# Patient Record
Sex: Female | Born: 1990 | Race: White | Hispanic: No | Marital: Single | Smoking: Current every day smoker
Health system: Southern US, Community
[De-identification: ages and names within clinical notes are randomized; demographics above are authoritative.]

---

## 2018-01-04 ENCOUNTER — Emergency Department (HOSPITAL_COMMUNITY): Payer: Medicaid Other

## 2018-01-04 ENCOUNTER — Emergency Department (HOSPITAL_COMMUNITY)
Admission: EM | Admit: 2018-01-04 | Discharge: 2018-01-04 | Disposition: A | Discharge status: Home / Self Care | Payer: Medicaid Other | Attending: Emergency Medicine | Admitting: Emergency Medicine

## 2018-01-04 ENCOUNTER — Encounter (HOSPITAL_COMMUNITY): Payer: Self-pay

## 2018-01-04 DIAGNOSIS — Y939 Activity, unspecified: Secondary | ICD-10-CM | POA: Insufficient documentation

## 2018-01-04 DIAGNOSIS — Y999 Unspecified external cause status: Secondary | ICD-10-CM | POA: Insufficient documentation

## 2018-01-04 DIAGNOSIS — F17210 Nicotine dependence, cigarettes, uncomplicated: Secondary | ICD-10-CM | POA: Insufficient documentation

## 2018-01-04 DIAGNOSIS — S0990XA Unspecified injury of head, initial encounter: Secondary | ICD-10-CM | POA: Insufficient documentation

## 2018-01-04 DIAGNOSIS — N3001 Acute cystitis with hematuria: Secondary | ICD-10-CM | POA: Insufficient documentation

## 2018-01-04 DIAGNOSIS — Y929 Unspecified place or not applicable: Secondary | ICD-10-CM | POA: Diagnosis not present

## 2018-01-04 LAB — BASIC METABOLIC PANEL
Anion gap: 12 (ref 5–15)
BUN: 8 mg/dL (ref 6–20)
CALCIUM: 9 mg/dL (ref 8.9–10.3)
CHLORIDE: 107 mmol/L (ref 98–111)
CO2: 20 mmol/L — ABNORMAL LOW (ref 22–32)
CREATININE: 0.89 mg/dL (ref 0.44–1.00)
GFR calc non Af Amer: >60 mL/min (ref >60)
Glucose, Bld: 96 mg/dL (ref 70–99)
Potassium: 4.6 mmol/L (ref 3.5–5.1)
SODIUM: 139 mmol/L (ref 135–145)

## 2018-01-04 LAB — URINALYSIS, ROUTINE W REFLEX MICROSCOPIC
BILIRUBIN URINE: NEGATIVE
GLUCOSE, UA: NEGATIVE mg/dL
KETONES UR: 5 mg/dL — AB
Nitrite: POSITIVE — AB
PH: 7 (ref 5.0–8.0)
PROTEIN: NEGATIVE mg/dL
Specific Gravity, Urine: 1.036 — ABNORMAL HIGH (ref 1.005–1.030)

## 2018-01-04 LAB — CBC WITH DIFFERENTIAL/PLATELET
Abs Immature Granulocytes: 0 10*3/uL (ref 0.0–0.1)
BASOS PCT: 1 %
Basophils Absolute: 0.1 10*3/uL (ref 0.0–0.1)
EOS ABS: 0.1 10*3/uL (ref 0.0–0.7)
Eosinophils Relative: 1 %
HCT: 42.2 % (ref 36.0–46.0)
Hemoglobin: 13.8 g/dL (ref 12.0–15.0)
IMMATURE GRANULOCYTES: 0 %
Lymphocytes Relative: 16 %
Lymphs Abs: 2.2 10*3/uL (ref 0.7–4.0)
MCH: 30 pg (ref 26.0–34.0)
MCHC: 32.7 g/dL (ref 30.0–36.0)
MCV: 91.7 fL (ref 78.0–100.0)
Monocytes Absolute: 0.9 10*3/uL (ref 0.1–1.0)
Monocytes Relative: 6 %
NEUTROS PCT: 76 %
Neutro Abs: 10.5 10*3/uL — ABNORMAL HIGH (ref 1.7–7.7)
PLATELETS: 224 10*3/uL (ref 150–400)
RBC: 4.6 MIL/uL (ref 3.87–5.11)
RDW: 15.6 % — AB (ref 11.5–15.5)
WBC: 13.8 10*3/uL — AB (ref 4.0–10.5)

## 2018-01-04 LAB — I-STAT BETA HCG BLOOD, ED (MC, WL, AP ONLY)

## 2018-01-04 MED ORDER — TETANUS-DIPHTH-ACELL PERTUSSIS 5-2.5-18.5 LF-MCG/0.5 IM SUSP
0.5000 mL | Freq: Once | INTRAMUSCULAR | Status: AC
  Administered 2018-01-04: 0.5 mL via INTRAMUSCULAR
  Filled 2018-01-04: qty 0.5

## 2018-01-04 MED ORDER — CEPHALEXIN 500 MG PO CAPS: 500.0000 mg | ORAL_CAPSULE | Freq: Two times a day (BID) | ORAL | 0 refills | Status: DC

## 2018-01-04 MED ORDER — METHOCARBAMOL 500 MG PO TABS: 500.0000 mg | ORAL_TABLET | Freq: Two times a day (BID) | ORAL | 0 refills | Status: DC

## 2018-01-04 MED ORDER — SODIUM CHLORIDE 0.9 % IV BOLUS
1000.0000 mL | Freq: Once | INTRAVENOUS | Status: AC
  Administered 2018-01-04: 1000 mL via INTRAVENOUS

## 2018-01-04 MED ORDER — IOPAMIDOL (ISOVUE-300) INJECTION 61%
INTRAVENOUS | Status: AC
  Administered 2018-01-04: 100 mL
  Filled 2018-01-04: qty 100

## 2018-01-04 MED ORDER — MORPHINE SULFATE (PF) 4 MG/ML IV SOLN
4.0000 mg | Freq: Once | INTRAVENOUS | Status: AC
  Administered 2018-01-04: 4 mg via INTRAVENOUS
  Filled 2018-01-04: qty 1

## 2018-01-04 MED ORDER — NAPROXEN 375 MG PO TABS: 375.0000 mg | ORAL_TABLET | Freq: Two times a day (BID) | ORAL | 0 refills | Status: DC
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
## 2018-01-04 NOTE — ED Notes (Signed)
Patient verbalizes understanding of medications and discharge instructions. No further questions at this time. VSS and patient ambulatory at discharge.   
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
## 2020-12-06 DIAGNOSIS — D539 Nutritional anemia, unspecified: Secondary | ICD-10-CM | POA: Insufficient documentation

## 2020-12-06 DIAGNOSIS — D72829 Elevated white blood cell count, unspecified: Secondary | ICD-10-CM | POA: Insufficient documentation

## 2020-12-08 DIAGNOSIS — E538 Deficiency of other specified B group vitamins: Secondary | ICD-10-CM | POA: Insufficient documentation

## 2020-12-14 ENCOUNTER — Telehealth: Payer: Self-pay | Admitting: Oncology

## 2020-12-14 NOTE — Telephone Encounter (Signed)
Patient referred by Mauricio Po, NP for Elevated WBC/Iron-Ferritin Saturation -Abnormal U/S Liver.   Appt made 12/16/20 Labs 3:15 pm - Consult 3:45 pm

## 2020-12-15 ENCOUNTER — Other Ambulatory Visit: Payer: Self-pay | Admitting: Oncology

## 2020-12-15 DIAGNOSIS — D539 Nutritional anemia, unspecified: Secondary | ICD-10-CM

## 2020-12-15 NOTE — Progress Notes (Signed)
[facility]  [address] [place],  [place]  [address] [phone]  Clinic Day:  12/16/2020  Referring physician: No ref. provider found  This document serves as a record of services personally performed by Gery Pray, MD. It was created on their behalf by Curry,Lauren E, a trained medical scribe. The creation of this record is based on the scribe's personal observations and the provider's statements to them.  CHIEF COMPLAINT:  CC: Abnormal labs, severe anemia and abnormal abdominal ultrasound  Current Treatment:  Diagnostic testing and labs for further evaluation   HISTORY OF PRESENT ILLNESS:  Jacqueline Walker is a 30 y.o. female referred by Mauricio Po, NP-C for the evaluation and treatment of abnormal labs and liver scan.  The patient had been complaining of abdominal pain as well as nausea and vomiting, and lab evaluation from August 9th revealed leukocytosis with a white count of 11.2 along with anemia with a hemoglobin of 8.7 with an MCV of 137.8.  Her potassium was low at 3.0, but CMP was otherwise normal.  Iron was significantly elevated at 365 with a TIBC of 382 for a percent saturation of 96.  Transferrin was normal at 273, reticulocyte count was elevated at 3.2 and ferritin was elevated at 462.  B12 was normal at 241 and folate was low at 3.5.  Abdominal ultrasound was pursued on August 10th and revealed cholelithiasis without evidence of cholecystitis or biliary dilatation, however, echogenic liver compatible with chronic liver disease was observed.  Repeat lab evaluation from August 11th confirmed a white count of 11.5 and a hemoglobin of 8.3.  CMP was unremarkable except for a potassium of 3.4, and a total bilirubin of 2.5.  INTERVAL HISTORY:  Jacqueline Walker states that her mother has fatty liver, but no other liver disease is in her family.  She reports orange colored urine since 2019 when she was pregnant.  She states that she is not taking any  oral iron.  She has been placed on folic acid 1 mg daily and oral potassium 20 mg daily and has taken this for a couple of days.  She denies muscle cramps.  She has had some mild left hip pain.  She also notes occasional chest pain and palpitations which happens daily and is worsened with heat.  She states that she and her fiance drink 1/5 of liquor between them every day.  They are trying to cut back.  She underwent nuclear medicine hepatobiliary scan today which revealed no uptake in the gallbladder after 2 hours.  Findings are suggestive for cystic duct obstruction and can be associated with cholecystitis and so she is to see a surgeon in regards to possible emergency cholecystectomy.  Hemoglobin is 8.8, white count is normal at 8.9 and platelet count is normal at 328,000.  Chemistries are unremarkable except for a potassium of 3.1.  Her total bilirubin has improved to 1.2.  Her  appetite is good, and she is eating well.  She denies fever, chills or other signs of infection.  She denies nausea, vomiting, bowel issues, or abdominal pain.  She denies sore throat, cough or dyspnea.  Her fiance accompanies her today.  REVIEW OF SYSTEMS:  Review of Systems  Constitutional: Negative.  Negative for appetite change, chills, fatigue, fever and unexpected weight change.  HENT:  Negative.    Eyes: Negative.   Respiratory: Negative.  Negative for chest tightness, cough, hemoptysis, shortness of breath and wheezing.   Cardiovascular:  Positive for chest pain (  occasional) and palpitations. Negative for leg swelling.  Gastrointestinal: Negative.  Negative for abdominal distention, abdominal pain, blood in stool, constipation, diarrhea, nausea and vomiting.  Endocrine: Negative.  Negative for hot flashes.  Genitourinary:  Negative for difficulty urinating, dysuria, frequency and hematuria.        Orange colored urine  Musculoskeletal: Negative.  Negative for arthralgias, back pain, flank pain, gait problem and  myalgias.  Skin: Negative.  Negative for rash.  Neurological: Negative.  Negative for dizziness, extremity weakness, gait problem, headaches, light-headedness, numbness, seizures and speech difficulty.  Hematological: Negative.   Psychiatric/Behavioral: Negative.  Negative for depression and sleep disturbance. The patient is not nervous/anxious.     VITALS:  Blood pressure (!) 139/99, pulse 100, temperature 99.2 F (37.3 C), temperature source Oral, resp. rate 18, height 5\' 8"  (1.727 m), weight 291 lb 4.8 oz (132.1 kg), SpO2 100 %.  Wt Readings from Last 3 Encounters:  12/16/20 291 lb 4.8 oz (132.1 kg)    Body mass index is 44.29 kg/m.  Performance status (ECOG): 1 - Symptomatic but completely ambulatory  PHYSICAL EXAM:  Physical Exam Constitutional:      General: She is not in acute distress.    Appearance: Normal appearance. She is normal weight.  HENT:     Head: Normocephalic and atraumatic.  Eyes:     General: No scleral icterus.    Extraocular Movements: Extraocular movements intact.     Conjunctiva/sclera: Conjunctivae normal.     Pupils: Pupils are equal, round, and reactive to light.  Cardiovascular:     Rate and Rhythm: Normal rate and regular rhythm.     Pulses: Normal pulses.     Heart sounds: Normal heart sounds. No murmur heard.   No friction rub. No gallop.  Pulmonary:     Effort: Pulmonary effort is normal. No respiratory distress.     Breath sounds: Wheezing (mild, expiratory in all lung fields) present.  Abdominal:     General: Bowel sounds are normal. There is no distension.     Palpations: Abdomen is soft. There is hepatomegaly (measures 16 cm below the right costal margin, nodular and mildly tender). There is no splenomegaly or mass.     Tenderness: There is no abdominal tenderness.  Musculoskeletal:        General: Normal range of motion.     Cervical back: Normal range of motion and neck supple.     Right lower leg: Edema (mild) present.     Left  lower leg: Edema (mild) present.  Lymphadenopathy:     Cervical: No cervical adenopathy.  Skin:    General: Skin is warm and dry.     Coloration: Skin is mottled (of the lower extremities).  Neurological:     General: No focal deficit present.     Mental Status: She is alert and oriented to person, place, and time. Mental status is at baseline.  Psychiatric:        Mood and Affect: Mood normal.        Behavior: Behavior normal.        Thought Content: Thought content normal.        Judgment: Judgment normal.   She may have fullness in the left upper quadrant, but I cannot adequately assess her spleen due to obesity.   LABS:   CBC Latest Ref Rng & Units 12/16/2020 01/04/2018  WBC - 8.9 13.8(H)  Hemoglobin 12.0 - 16.0 8.8(A) 13.8  Hematocrit 36 - 46 26(A) 42.2  Platelets 150 -  399 328 224   CMP Latest Ref Rng & Units 12/16/2020 01/04/2018  Glucose 70 - 99 mg/dL - 96  BUN 4 - 21 9 8   Creatinine 0.5 - 1.1 0.6 0.89  Sodium 137 - 147 142 139  Potassium 3.4 - 5.3 3.1(A) 4.6  Chloride 99 - 108 105 107  CO2 13 - 22 26(A) 20(L)  Calcium 8.7 - 10.7 9.2 9.0  Alkaline Phos 25 - 125 89 -  AST 13 - 35 42(A) -  ALT 7 - 35 29 -    No results found for: TOTALPROTELP, ALBUMINELP, A1GS, A2GS, BETS, BETA2SER, GAMS, MSPIKE, SPEI No results found for: TIBC, FERRITIN, IRONPCTSAT No results found for: LDH  STUDIES:  No results found.   EXAM: 12/07/2020 ABDOMEN ULTRASOUND COMPLETE  COMPARISON:  CT abdomen pelvis 01/04/2018  FINDINGS: Gallbladder: Cholelithiasis. Gallstones measuring up to 2.2 cm. Negative sonographic Murphy sign. Gallbladder wall 2.8 mm in diameter, upper normal  Common bile duct: Diameter: 3 mm  Liver: Increased echogenicity liver diffusely without focal liver lesion. Portal vein is patent on color Doppler imaging with normal direction of blood flow towards the liver.  IVC: No abnormality visualized.  Pancreas: Poorly visualized  Spleen: Size and appearance within  normal limits.  Right Kidney: Length: 11.1 cm. Echogenicity within normal limits. No mass or hydronephrosis visualized.  Left Kidney: Length: 10.7 cm. Echogenicity within normal limits. No mass or hydronephrosis visualized.  Abdominal aorta: No aneurysm visualized.  Other findings: Image quality degraded by body habitus  IMPRESSION: Cholelithiasis without evidence of cholecystitis or biliary dilatation  Echogenic liver compatible with chronic liver disease.  EXAM: 12/16/2020 NUCLEAR MEDICINE HEPATOBILIARY IMAGING  TECHNIQUE: Sequential images of the abdomen were obtained out to 60 minutes following intravenous administration of radiopharmaceutical.  RADIOPHARMACEUTICALS:  5.2 mCi Tc-27m  Choletec IV  COMPARISON:  Abdominal ultrasound 12/07/2020. Abdominal CT 01/04/2018  FINDINGS: Prompt uptake and biliary excretion of activity by the liver. Excretion into the duodenum. Imaging was followed out to 120 minutes. No definite uptake within the gallbladder.  IMPRESSION: No uptake in the gallbladder after 2 hours. Findings are suggestive for cystic duct obstruction and can be associated with cholecystitis.   HISTORY:  No past medical history on file.  Past Surgical History:  Procedure Laterality Date   CESAREAN SECTION  08/20/2017    No family history on file.  Social History:  reports that she has been smoking cigarettes. She has been smoking an average of 1 pack per day. She has never used smokeless tobacco. She reports current alcohol use. She reports current drug use. Drug: Marijuana.  She denies other drugs or injectable substances.  The patient is accompanied by her fiance today.  She is engaged and lives at home with him.  She has 1 child.  She is currently unemployed, and has never been exposed to chemicals or other toxic agents.  She has Medicaid.  She states that she and her fiance have been averaging 1/5 of liquor a day, but has been trying to cut back.  She has  been smoking since age 7.    Allergies: No Known Allergies  Current Medications: Current Outpatient Medications  Medication Sig Dispense Refill   folic acid (FOLVITE) 1 MG tablet Take 1 mg by mouth daily.     medroxyPROGESTERone Acetate 150 MG/ML SUSY Inject 1 mL into the muscle every 3 (three) months.     potassium chloride (KLOR-CON) 10 MEQ tablet Take 1 tablet (10 mEq total) by mouth 3 (three) times  daily. 90 tablet 1   No current facility-administered medications for this visit.     ASSESSMENT & PLAN:   Assessment:   Echogenic liver compatible with chronic liver disease.  She also has significant hepatomegaly, 16 cm below the right costal margin, and possible splenomegaly.  Her mother has fatty liver disease, but there is no other liver disease reported in her family.  I am sure her alcohol abuse is contributing to this.  We discussed the importance of abstaining from alcohol.  I will check her for Hepatitis A,B and C, Wilson's disease or alpha 1 antitrypsin deficiency for completeness.  I advised that she avoid Tylenol, but may continue to use Advil.  Significantly elevated iron level.  I will check her for hemachromatosis for completeness.    Anemia.  She has been placed on folic acid 1 mg daily, which I advised she continue.  I also advised that she avoid oral iron supplement.  Her hemoglobin is mildly improved today at at 8.8.  Alcohol abuse.  Between her and her fiance, they drink 1/5 of liquor per day.  She states that they are trying to cut back.  She states that they have quit previously.  I have advised her that she needs to stop drinking entirely and that this is a danger to her life.  Tobacco abuse.  She currently smokes between 1-1.5 pack per day.  Need for emergency cholecystectomy as there was no uptake in the gallbladder after 2 hours.  Findings are suggestive for cystic duct obstruction and can be associated with cholecystitis.  I explained that she needs to normalize  her potassium and improve her liver functions in order to safely tolerate anesthesia.    Occasional chest pain, dyspnea on exertion and palpitations, which could be secondary to her anemia.  We will evaluate with echocardiogram.  Hypokalemia.  She has been placed on oral supplement 20 meq daily.  As her level has dropped lower, I will increase her dose to 30 meq daily, and will send in a prescription for 10 meq to take TID.    Plan: This is a pleasant 30 year old female with alcohol abuse, most likely resulting in alcoholic hepatitis.  However, she also has multiple other concerns including obstructive cholecystitis, possible iron overload, anemia, and folate deficiency.  I think she needs a CT scan to further evaluate her hepatomegaly and possible splenic enlargement.  This may help [place] assess whether she has early changes of liver cirrhosis. I think she needs an echocardiogram to evaluate her chest symptoms.  I have drawn a number of labs to evaluate her liver disease and her anemia, and I will follow up on those.  I have counseled her that she needs to stop her alcohol and suggested that she consider Alcoholics Anonymous.  She will continue the folate and potassium supplement, but I will change the potassium to 10 meq TID since she is not tolerating the 20 meq tablets.  I have explained that some of these issues need to be corrected in order for her to safely have her surgery for cholecystectomy.  She will see Rene Kocher next week and I assume have repeat labs drawn.  We will decide on follow up after her surgery and we have the rest of the results back.  She and her fiance understand and agree with this plan of care.  I have answered their questions and they know to call with any concerns.    Thank you for the opportunity to  participate in the care of your patients   I provided 50 minutes of face-to-face time during this this encounter and > 50% was spent counseling as documented under my assessment and  plan.    Dellia Beckwithhristine H Moyinoluwa Dawe, MD [facility] [address] [place] [id number] Dept: [phone] Dept Fax: [phone]   I, Foye DeerLauren Curry, am acting as scribe for Dellia Beckwithhristine H. Masey Scheiber, MD  I have reviewed this report as typed by the medical scribe, and it is complete and accurate.

## 2020-12-16 ENCOUNTER — Encounter: Payer: Self-pay | Admitting: Oncology

## 2020-12-16 ENCOUNTER — Inpatient Hospital Stay: Payer: Medicaid Other | Attending: Oncology

## 2020-12-16 ENCOUNTER — Other Ambulatory Visit: Payer: Self-pay | Admitting: Oncology

## 2020-12-16 ENCOUNTER — Inpatient Hospital Stay (INDEPENDENT_AMBULATORY_CARE_PROVIDER_SITE_OTHER): Payer: Medicaid Other | Admitting: Oncology

## 2020-12-16 DIAGNOSIS — E876 Hypokalemia: Secondary | ICD-10-CM

## 2020-12-16 DIAGNOSIS — R160 Hepatomegaly, not elsewhere classified: Secondary | ICD-10-CM | POA: Diagnosis not present

## 2020-12-16 DIAGNOSIS — K709 Alcoholic liver disease, unspecified: Secondary | ICD-10-CM

## 2020-12-16 DIAGNOSIS — F1010 Alcohol abuse, uncomplicated: Secondary | ICD-10-CM

## 2020-12-16 DIAGNOSIS — D539 Nutritional anemia, unspecified: Secondary | ICD-10-CM

## 2020-12-16 DIAGNOSIS — F17210 Nicotine dependence, cigarettes, uncomplicated: Secondary | ICD-10-CM | POA: Diagnosis not present

## 2020-12-16 DIAGNOSIS — R079 Chest pain, unspecified: Secondary | ICD-10-CM | POA: Diagnosis not present

## 2020-12-16 DIAGNOSIS — D72829 Elevated white blood cell count, unspecified: Secondary | ICD-10-CM | POA: Insufficient documentation

## 2020-12-16 DIAGNOSIS — R0609 Other forms of dyspnea: Secondary | ICD-10-CM | POA: Diagnosis not present

## 2020-12-16 DIAGNOSIS — R002 Palpitations: Secondary | ICD-10-CM | POA: Diagnosis not present

## 2020-12-16 DIAGNOSIS — E538 Deficiency of other specified B group vitamins: Secondary | ICD-10-CM | POA: Insufficient documentation

## 2020-12-16 LAB — BASIC METABOLIC PANEL
BUN: 9 (ref 4–21)
CO2: 26 — AB (ref 13–22)
Chloride: 105 (ref 99–108)
Creatinine: 0.6 (ref 0.5–1.1)
Glucose: 89
Potassium: 3.1 — AB (ref 3.4–5.3)
Sodium: 142 (ref 137–147)

## 2020-12-16 LAB — RETICULOCYTES
Immature Retic Fract: 44.1 % — ABNORMAL HIGH (ref 2.3–15.9)
RBC.: 1.9 MIL/uL — ABNORMAL LOW (ref 3.87–5.11)
Retic Count, Absolute: 215.1 10*3/uL — ABNORMAL HIGH (ref 19.0–186.0)
Retic Ct Pct: 11.3 % — ABNORMAL HIGH (ref 0.4–3.1)

## 2020-12-16 LAB — COMPREHENSIVE METABOLIC PANEL
Albumin: 3.9 (ref 3.5–5.0)
Calcium: 9.2 (ref 8.7–10.7)

## 2020-12-16 LAB — FERRITIN: Ferritin: 338 ng/mL — ABNORMAL HIGH (ref 11–307)

## 2020-12-16 LAB — FOLATE: Folate: 5.2 ng/mL — ABNORMAL LOW (ref >5.9)

## 2020-12-16 LAB — CBC AND DIFFERENTIAL
HCT: 26 — AB (ref 36–46)
Hemoglobin: 8.8 — AB (ref 12.0–16.0)
Neutrophils Absolute: 6.41
Platelets: 328 (ref 150–399)
WBC: 8.9

## 2020-12-16 LAB — IRON AND TIBC
Iron: 123 ug/dL (ref 28–170)
Saturation Ratios: 29 % (ref 10.4–31.8)
TIBC: 426 ug/dL (ref 250–450)
UIBC: 303 ug/dL

## 2020-12-16 LAB — HEPATIC FUNCTION PANEL
ALT: 29 (ref 7–35)
AST: 42 — AB (ref 13–35)
Alkaline Phosphatase: 89 (ref 25–125)
Bilirubin, Total: 1.2

## 2020-12-16 LAB — CBC: RBC: 1.95 — AB (ref 3.87–5.11)

## 2020-12-16 MED ORDER — POTASSIUM CHLORIDE CRYS ER 10 MEQ PO TBCR: 10.0000 meq | EXTENDED_RELEASE_TABLET | Freq: Three times a day (TID) | ORAL | 1 refills | Status: DC

## 2020-12-17 LAB — HEPATITIS PANEL, ACUTE
HCV Ab: NONREACTIVE
Hep A IgM: NONREACTIVE
Hep B C IgM: NONREACTIVE
Hepatitis B Surface Ag: NONREACTIVE

## 2020-12-17 LAB — ALPHA-1-ANTITRYPSIN: A-1 Antitrypsin, Ser: 213 mg/dL — ABNORMAL HIGH (ref 100–188)

## 2020-12-17 LAB — CERULOPLASMIN: Ceruloplasmin: 35.8 mg/dL (ref 19.0–39.0)

## 2020-12-17 LAB — HAPTOGLOBIN: Haptoglobin: 113 mg/dL (ref 33–278)

## 2020-12-19 ENCOUNTER — Encounter: Payer: Self-pay | Admitting: Oncology

## 2020-12-19 NOTE — Progress Notes (Unsigned)
Per [place] @[employer]  MCD/[phone] CPT CODE ECHO; does required authorization.   Auto-approved; Authorization [id number]  Valid from 12/19/2020 - 02/02/2021

## 2020-12-22 LAB — HEMOCHROMATOSIS DNA-PCR(C282Y,H63D)

## 2020-12-23 ENCOUNTER — Telehealth: Payer: Self-pay

## 2020-12-23 DIAGNOSIS — K709 Alcoholic liver disease, unspecified: Secondary | ICD-10-CM | POA: Insufficient documentation

## 2020-12-23 DIAGNOSIS — F1010 Alcohol abuse, uncomplicated: Secondary | ICD-10-CM | POA: Insufficient documentation

## 2020-12-23 NOTE — Telephone Encounter (Signed)
Patient notified. Also patient states she had gallbladder surgery 12/21/20 by Dr. Lequita Halt.

## 2020-12-23 NOTE — Telephone Encounter (Signed)
-----   Message from Dellia Beckwith, MD sent at 12/23/2020  7:35 AM EDT ----- Regarding: call pt Tell her folate still low, needs to stay on it, but iron levels increased. Tests for hepatitis or hereditary liver disease are negative. Has she seen surgeon yet?  I guess the schedulers are still working on CT & ECHO

## 2021-06-23 ENCOUNTER — Telehealth: Payer: Self-pay

## 2021-06-23 NOTE — Telephone Encounter (Addendum)
°   °  Previous Messages   ----- Message -----  From: Mellody Drown  Sent: 06/26/2021  10:12 AM EST  To: Derwood Kaplan, MD  Subject: RE: appt; recent hospitalization w/ Hgb 4.2     Patient has been scheduled for her follow-up for Thursday. She says she was at [facility].   ----- Message -----  From: Derwood Kaplan, MD  Sent: 06/23/2021   6:25 PM EST  To: Belva Chimes, LPN, Chelsia Serres Cherylann Banas, RN, *  Subject: RE: appt; recent hospitalization w/ Hgb 4.2     Which hospital?  The options I see would be noon on Tuesday or 8:30 am on Wed,Th or Fri.  Let me know so I can order labs.  ----- Message -----  From: Dairl Ponder, RN  Sent: 06/23/2021   3:24 PM EST  To: Derwood Kaplan, MD  Subject: appt; recent hospitalization w/ Hgb 4.2             Pt called to ask for an appt (per hospital discharge). She was admitted to hospital this past Tuesday with Hgb 4.2. She was given 4 units of blood while IP. No hematuria. No blood seen in stool per pt. They discharged her yesterday w/ HGB of 7.5 and "I feel 100% better". I asked if she had ever been seen by gastroenterologist for upper and/or lower endoscopy. She replied No.

## 2021-06-28 ENCOUNTER — Other Ambulatory Visit: Payer: Self-pay | Admitting: Oncology

## 2021-06-28 DIAGNOSIS — D539 Nutritional anemia, unspecified: Secondary | ICD-10-CM

## 2021-06-28 NOTE — Progress Notes (Signed)
[facility]  [address] [place],  [place]  [address] [phone]  Clinic Day:  06/29/2021  Referring physician: Dema Severin, NP  This document serves as a record of services personally performed by Jacqueline Pray, MD. It was created on their behalf by Jacqueline Walker, a trained medical scribe. The creation of this record is based on the scribe's personal observations and the provider's statements to them.  CHIEF COMPLAINT:  CC: Abnormal labs, severe anemia and abnormal abdominal ultrasound  Current Treatment:  Surveillance   HISTORY OF PRESENT ILLNESS:  Jacqueline Walker is a 31 y.o. female referred by Jacqueline Po, NP-C for the evaluation and treatment of abnormal labs and liver scan.  The patient had been complaining of abdominal pain as well as nausea and vomiting, and lab evaluation from August 9th revealed leukocytosis with a white count of 11.2 along with anemia with a hemoglobin of 8.7 with an MCV of 137.8.  Her potassium was low at 3.0, but CMP was otherwise normal.  Iron was significantly elevated at 365 with a TIBC of 382 for a percent saturation of 96.  Transferrin was normal at 273, reticulocyte count was elevated at 3.2 and ferritin was elevated at 462.  B12 was normal at 241 and folate was low at 3.5.  Abdominal ultrasound was pursued on August 10th and revealed cholelithiasis without evidence of cholecystitis or biliary dilatation, however, echogenic liver compatible with chronic liver disease was observed.  Repeat lab evaluation from August 11th confirmed a white count of 11.5 and a hemoglobin of 8.3.  CMP was unremarkable except for a potassium of 3.4, and a total bilirubin of 2.5. She has been checked for hemochromatosis, Wilson's disease, and alpha-1 antitrypsin deficiency since her mother has also had liver disease. I did find a folate deficiency in August 2022 and ordered 1 mg daily. She stated that she and her fiance drink 1/5 of liquor  between them every day. She underwent cholecystectomy in August 2022.  INTERVAL HISTORY:  Jacqueline Walker has been added onto the schedule today due to recent severe anemia. She was admitted to the hospital on February 21st with a hemoglobin of 4.2. She received 4 units of packed red cells with improvement in her hemoglobin to 7.5. Stool was negative for blood. She states that ultrasounds and other testing revealed no etiology of her blood loss. However, she did not undergo aggressive GI evaluation with endoscopy or colonoscopy. She states that she is doing much better since her transfusion and feels well. She denies melena, hematuria or other forms of overt bleeding. She admits that she has not been taking oral folate supplement. We have drawn a level today. She states that she is being started on B12 injections monthly in the near future through her primary care. She does admit to drinking alcohol regularly, but states that she has cut back some, but she would not quantitate this for me. I again admonished the importance of the complete abstinence of alcohol. She continues the Depo injections and has not had menstrual period since 2019. She has had some orange urine, but states that this has improved. Hemoglobin today is 9.0 with an MCV of 111, and white count and platelets are normal. Chemistries are unremarkable except for mildly elevated liver transaminases and a bilirubin of 1.4, previously 1.2. Her  appetite is good, and she has lost 3 pounds since her last visit.  She denies fever, chills or other signs of infection.  She denies nausea,  vomiting, bowel issues, or abdominal pain.  She denies sore throat, cough, dyspnea, or chest pain.  REVIEW OF SYSTEMS:  Review of Systems  Constitutional: Negative.  Negative for appetite change, chills, fatigue, fever and unexpected weight change.  HENT:  Negative.    Eyes: Negative.   Respiratory: Negative.  Negative for chest tightness, cough, hemoptysis, shortness of  breath and wheezing.   Cardiovascular: Negative.  Negative for chest pain, leg swelling and palpitations.  Gastrointestinal: Negative.  Negative for abdominal distention, abdominal pain, blood in stool, constipation, diarrhea, nausea and vomiting.  Endocrine: Negative.   Genitourinary: Negative.  Negative for difficulty urinating, dysuria, frequency and hematuria.   Musculoskeletal: Negative.  Negative for arthralgias, back pain, flank pain, gait problem and myalgias.  Skin: Negative.   Neurological: Negative.  Negative for dizziness, extremity weakness, gait problem, headaches, light-headedness, numbness, seizures and speech difficulty.  Hematological: Negative.   Psychiatric/Behavioral: Negative.  Negative for depression and sleep disturbance. The patient is not nervous/anxious.     VITALS:  Pulse (!) 12, temperature (!) 97.3 F (36.3 C), temperature source Oral, resp. rate 20, height 5\' 7"  (1.702 m), weight 287 lb 14.4 oz (130.6 kg), SpO2 100 %.  Wt Readings from Last 3 Encounters:  06/29/21 287 lb 14.4 oz (130.6 kg)  12/16/20 291 lb 4.8 oz (132.1 kg)    Body mass index is 45.09 kg/m.  Performance status (ECOG): 0 - Asymptomatic  PHYSICAL EXAM:  Physical Exam Constitutional:      General: She is not in acute distress.    Appearance: Normal appearance. She is normal weight.  HENT:     Head: Normocephalic and atraumatic.  Eyes:     General: No scleral icterus.    Extraocular Movements: Extraocular movements intact.     Conjunctiva/sclera: Conjunctivae normal.     Pupils: Pupils are equal, round, and reactive to light.  Cardiovascular:     Rate and Rhythm: Normal rate and regular rhythm.     Pulses: Normal pulses.     Heart sounds: Normal heart sounds. No murmur heard.   No friction rub. No gallop.  Pulmonary:     Effort: Pulmonary effort is normal. No respiratory distress.     Breath sounds: Normal breath sounds.  Abdominal:     General: Bowel sounds are normal. There is  no distension.     Palpations: Abdomen is soft. There is hepatomegaly (mild). There is no splenomegaly or mass.     Tenderness: There is no abdominal tenderness.  Musculoskeletal:        General: Normal range of motion.     Cervical back: Normal range of motion and neck supple.     Right foot: Swelling (1+ edema) present.     Left foot: Swelling (1+ edema) present.  Lymphadenopathy:     Cervical: No cervical adenopathy.  Skin:    General: Skin is warm and dry.  Neurological:     General: No focal deficit present.     Mental Status: She is alert and oriented to person, place, and time. Mental status is at baseline.  Psychiatric:        Mood and Affect: Mood normal.        Behavior: Behavior normal.        Thought Content: Thought content normal.        Judgment: Judgment normal.   LABS:   CBC Latest Ref Rng & Units 06/29/2021 12/16/2020 01/04/2018  WBC - 5.8 8.9 13.8(H)  Hemoglobin 12.0 - 16.0 9.0(A)  8.8(A) 13.8  Hematocrit 36 - 46 28(A) 26(A) 42.2  Platelets 150 - 399 251 328 224   CMP Latest Ref Rng & Units 06/29/2021 12/16/2020 01/04/2018  Glucose 70 - 99 mg/dL - - 96  BUN 4 - 21 7 9 8   Creatinine 0.5 - 1.1 0.5 0.6 0.89  Sodium 137 - 147 142 142 139  Potassium 3.4 - 5.3 3.9 3.1(A) 4.6  Chloride 99 - 108 109(A) 105 107  CO2 13 - 22 27(A) 26(A) 20(L)  Calcium 8.7 - 10.7 9.2 9.2 9.0  Alkaline Phos 25 - 125 86 89 -  AST 13 - 35 39(A) 42(A) -  ALT 7 - 35 36(A) 29 -    No results found for: TOTALPROTELP, ALBUMINELP, A1GS, A2GS, BETS, BETA2SER, GAMS, MSPIKE, SPEI Lab Results  Component Value Date   TIBC 426 12/16/2020   FERRITIN 338 (H) 12/16/2020   IRONPCTSAT 29 12/16/2020   No results found for: LDH  STUDIES:  No results found.   EXAM: 06/20/2021 ULTRASOUND ABDOMEN LIMITED RIGHT UPPER QUADRANT   COMPARISON:  12/07/2020   FINDINGS:  Gallbladder:   Surgically absent   Common bile duct:   Diameter: 5 mm, normal   Liver:   Echogenic parenchyma, likely fatty  infiltration though this can be  seen with cirrhosis and certain infiltrative disorders. No focal  hepatic mass or nodularity identified, though assessment of  intrahepatic detail is limited due to sound attenuation. Portal vein  is patent on color Doppler imaging with normal direction of blood  flow towards the liver.   Other: No RIGHT upper quadrant free fluid.   IMPRESSION:  Post cholecystectomy.   Probable fatty infiltration of liver as above.   EXAM: 06/21/2021 ULTRASOUND ABDOMEN LIMITED   COMPARISON:  Abdominal ultrasound 12/07/2020.   FINDINGS:  Spleen is normal in appearance and measures 12.2 x 12.4 x 5.4 cm.  Splenic volume 431.4 cc.   IMPRESSION:  Spleen normal in size and appearance.   HISTORY:     Allergies: No Known Allergies  Current Medications: Current Outpatient Medications  Medication Sig Dispense Refill   folic acid (FOLVITE) 1 MG tablet Take 1 mg by mouth daily.     medroxyPROGESTERone Acetate 150 MG/ML SUSY Inject 1 mL into the muscle every 3 (three) months.     cefdinir (OMNICEF) 300 MG capsule Take 300 mg by mouth 2 (two) times daily. (Patient not taking: Reported on 06/29/2021)     potassium chloride (KLOR-CON) 10 MEQ tablet Take 1 tablet (10 mEq total) by mouth 3 (three) times daily. (Patient not taking: Reported on 06/29/2021) 90 tablet 1   No current facility-administered medications for this visit.     ASSESSMENT & PLAN:   Assessment:   Echogenic liver compatible with chronic liver disease.  She also has mild hepatomegaly.  Her mother has fatty liver disease, but there is no other liver disease reported in her family.  I am sure her alcohol abuse is contributing to this.  We discussed the importance of abstaining from alcohol. Other evaluation with Hepatitis A,B and C, Wilson's disease and alpha 1 antitrypsin deficiency was negative.  I advised that she avoid Tylenol, but may continue to use Advil if needed.  Significantly elevated iron level.   Evaluation for hemochromatosis was negative.  Anemia, which was significant in February with a hemoglobin of 4.2.  No etiology was found but she did not undergo GI evaluation. I advised that we pursue this and will make the appropriate referral. She does  admit to not taking oral folate supplement 1 mg daily, and I strongly advised that she start this. She will also be starting monthly B12 injections in the near future through her primary care physician.  Alcohol abuse.  Between her and her fiance, they drink 1/5 of liquor per day.  She states that she is trying to cut back.  I have again admonished the importance of abstaining from alcohol entirely and that this is a danger to her life.  Tobacco abuse.  She currently smokes between 1-1.5 pack per day.  Status post cholecystectomy.  Plan: She is doing much better since her transfusions, but no etiology has been found. As above, we will refer her to gastroenterology for further evaluation. I suspect that she may have had alcohol induced gastritis with slow blood loss. I again discussed with the patient the importance of the complete discontinuation of alcohol. Otherwise, we will see her back in 2 months with CBC and CMP. She understands and agrees with this plan of care.  I have answered her questions and she knows to call with any concerns.    I provided 20 minutes of face-to-face time during this this encounter and > 50% was spent counseling as documented under my assessment and plan.    Dellia Beckwith, MD [facility] AT [facility] [address] [employer] [place] [address] Dept: [phone] Dept Fax: [phone]   I, Foye Deer, am acting as scribe for Dellia Beckwith, MD  I have reviewed this report as typed by the medical scribe, and it is complete and accurate.

## 2021-06-29 ENCOUNTER — Telehealth: Payer: Self-pay | Admitting: Oncology

## 2021-06-29 ENCOUNTER — Inpatient Hospital Stay: Payer: Medicaid Other

## 2021-06-29 ENCOUNTER — Encounter: Payer: Self-pay | Admitting: Oncology

## 2021-06-29 ENCOUNTER — Other Ambulatory Visit: Payer: Self-pay | Admitting: Oncology

## 2021-06-29 ENCOUNTER — Inpatient Hospital Stay: Payer: Medicaid Other | Attending: Oncology | Admitting: Oncology

## 2021-06-29 ENCOUNTER — Other Ambulatory Visit: Payer: Self-pay

## 2021-06-29 VITALS — HR 102 | Temp 97.3°F | Resp 20 | Ht 67.0 in | Wt 287.9 lb

## 2021-06-29 DIAGNOSIS — R160 Hepatomegaly, not elsewhere classified: Secondary | ICD-10-CM | POA: Diagnosis not present

## 2021-06-29 DIAGNOSIS — E538 Deficiency of other specified B group vitamins: Secondary | ICD-10-CM | POA: Insufficient documentation

## 2021-06-29 DIAGNOSIS — D649 Anemia, unspecified: Secondary | ICD-10-CM | POA: Diagnosis not present

## 2021-06-29 DIAGNOSIS — D539 Nutritional anemia, unspecified: Secondary | ICD-10-CM

## 2021-06-29 DIAGNOSIS — Z9049 Acquired absence of other specified parts of digestive tract: Secondary | ICD-10-CM | POA: Insufficient documentation

## 2021-06-29 DIAGNOSIS — R935 Abnormal findings on diagnostic imaging of other abdominal regions, including retroperitoneum: Secondary | ICD-10-CM | POA: Insufficient documentation

## 2021-06-29 LAB — BASIC METABOLIC PANEL
BUN: 7 (ref 4–21)
CO2: 27 — AB (ref 13–22)
Chloride: 109 — AB (ref 99–108)
Creatinine: 0.5 (ref 0.5–1.1)
Glucose: 99
Potassium: 3.9 (ref 3.4–5.3)
Sodium: 142 (ref 137–147)

## 2021-06-29 LAB — COMPREHENSIVE METABOLIC PANEL
Albumin: 3.6 (ref 3.5–5.0)
Calcium: 9.2 (ref 8.7–10.7)

## 2021-06-29 LAB — CBC: RBC: 2.48 — AB (ref 3.87–5.11)

## 2021-06-29 LAB — HEPATIC FUNCTION PANEL
ALT: 36 — AB (ref 7–35)
AST: 39 — AB (ref 13–35)
Alkaline Phosphatase: 86 (ref 25–125)
Bilirubin, Total: 1.4

## 2021-06-29 LAB — IRON AND TIBC
Iron: 114 ug/dL (ref 28–170)
Saturation Ratios: 26 % (ref 10.4–31.8)
TIBC: 442 ug/dL (ref 250–450)
UIBC: 328 ug/dL

## 2021-06-29 LAB — VITAMIN B12: Vitamin B-12: 413 pg/mL (ref 180–914)

## 2021-06-29 LAB — CBC AND DIFFERENTIAL
HCT: 28 — AB (ref 36–46)
Hemoglobin: 9 — AB (ref 12.0–16.0)
Neutrophils Absolute: 4.06
Platelets: 251 (ref 150–399)
WBC: 5.8

## 2021-06-29 LAB — FOLATE: Folate: 2.9 ng/mL — ABNORMAL LOW (ref >5.9)

## 2021-06-29 LAB — FERRITIN: Ferritin: 322 ng/mL — ABNORMAL HIGH (ref 11–307)

## 2021-06-29 NOTE — Telephone Encounter (Signed)
Per 06/29/21 los next appt scheduled and confirmed with patient ?

## 2021-07-03 ENCOUNTER — Telehealth: Payer: Self-pay

## 2021-07-03 NOTE — Telephone Encounter (Signed)
-----   Message from Dellia Beckwith, MD sent at 06/29/2021 10:37 AM EST ----- ?Regarding: refer ?Pls refer GI in [place] for severe anemia, alcohol abuse, folate def in young woman. ?Her hgb dropped to 4.2 and admitted to [facility], given 4 units of blood, then 7.5, now 9.0 ? ?

## 2021-07-03 NOTE — Telephone Encounter (Signed)
Marline Backbone, RN faxed referral. ?

## 2021-07-07 ENCOUNTER — Encounter: Payer: Self-pay | Admitting: Oncology

## 2021-07-10 ENCOUNTER — Telehealth: Payer: Self-pay

## 2021-07-10 NOTE — Telephone Encounter (Signed)
-----   Message from Derwood Kaplan, MD sent at 07/07/2021  9:05 AM EST ----- ?Regarding: call ?Tell her folate level is even lower, she really needs to take every day.  Iron levels are high, she does not need that.  B12 is normal ? ?

## 2021-07-10 NOTE — Telephone Encounter (Signed)
Patient is taking her folate everyday.  ? ?Patient notified.  ?

## 2021-07-14 ENCOUNTER — Other Ambulatory Visit: Payer: Self-pay | Admitting: Oncology

## 2021-07-14 DIAGNOSIS — E538 Deficiency of other specified B group vitamins: Secondary | ICD-10-CM

## 2021-07-14 NOTE — Telephone Encounter (Signed)
Spoke with pharmacist at [facility] in [place]. She states that she will request to order it. Will know on Monday if they will receive it. Patient called and notified. Patient wants to know if she can double her folic acid. ?

## 2021-07-20 NOTE — Telephone Encounter (Signed)
Patient states she was not taking her folic acid everyday when she saw Dr. Gilman Buttner last and that she is taking it everyday now with a MVI. Also patient states that the pharmacy was unable to get the folic acid injectable. Patient wants to want and see what it is when she comes back in May for a follow up. Then she will decide on what she wants to do. ?

## 2021-08-28 NOTE — Progress Notes (Signed)
[facility]  [address] [place],  [place]  [address] [phone]  Clinic Day:  08/29/21  Referring physician: Delmer Islam, NP-C  CHIEF COMPLAINT:  CC: Abnormal labs, severe anemia and abnormal abdominal ultrasound  Current Treatment:  Surveillance   HISTORY OF PRESENT ILLNESS:  Jacqueline Walker is a 31 y.o. female referred by Mauricio Po, NP-C for the evaluation and treatment of abnormal labs and liver scan.  The patient had been complaining of abdominal pain as well as nausea and vomiting, and lab evaluation from August 9th revealed leukocytosis with a white count of 11.2 along with anemia with a hemoglobin of 8.7 with an MCV of 137.8.  Her potassium was low at 3.0, but CMP was otherwise normal.  Iron was significantly elevated at 365 with a TIBC of 382 for a percent saturation of 96.  Transferrin was normal at 273, reticulocyte count was elevated at 3.2 and ferritin was elevated at 462.  B12 was normal at 241 and folate was low at 3.5.  Abdominal ultrasound was pursued on August 10th and revealed cholelithiasis without evidence of cholecystitis or biliary dilatation, however, echogenic liver compatible with chronic liver disease was observed.  Repeat lab evaluation from August 11th confirmed a white count of 11.5 and a hemoglobin of 8.3.  CMP was unremarkable except for a potassium of 3.4, and a total bilirubin of 2.5. She has been checked for hemochromatosis, Wilson's disease, and alpha-1 antitrypsin deficiency since her mother has also had liver disease. I did find a folate deficiency in August 2022 and ordered 1 mg daily. She stated that she and her fiance drink 1/5 of liquor between them every day. She underwent cholecystectomy in August 2022.  INTERVAL HISTORY:  [name] is here for follow-up of her severe anemia.  When she was seen in March her hemoglobin was 9.0 with an MCV of 111.  She had evidence of abnormal liver function tests and admitted to  drinking a significant amount daily.  She tells me her alcohol intake is about the same despite several discussions that we have had about this.  She denies melena, hematuria or other forms of overt bleeding. She has now been taking oral folate supplement, and her level has come up to 9.8.  She was started on B12 injections monthly that through her primary care.  She continues the Depo injections and has not had menstrual period since 2019.  Today her hemoglobin is up to 17.2 with an MCV of 110 and normal white count, normal platelet count.  CMP is unremarkable and her liver function tests are improved.  Her  appetite is good, and she has lost 1 pounds since her last visit.  She denies fever, chills or other signs of infection.  She denies nausea, vomiting, bowel issues, or abdominal pain.  She denies sore throat, cough, dyspnea, or chest pain.  REVIEW OF SYSTEMS:  Review of Systems  Constitutional: Negative.  Negative for appetite change, chills, fatigue, fever and unexpected weight change.  HENT:  Negative.    Eyes: Negative.   Respiratory: Negative.  Negative for chest tightness, cough, hemoptysis, shortness of breath and wheezing.   Cardiovascular: Negative.  Negative for chest pain, leg swelling and palpitations.  Gastrointestinal: Negative.  Negative for abdominal distention, abdominal pain, blood in stool, constipation, diarrhea, nausea and vomiting.  Endocrine: Negative.   Genitourinary: Negative.  Negative for difficulty urinating, dysuria, frequency and hematuria.   Musculoskeletal: Negative.  Negative for arthralgias, back pain, flank  pain, gait problem and myalgias.  Skin: Negative.   Neurological: Negative.  Negative for dizziness, extremity weakness, gait problem, headaches, light-headedness, numbness, seizures and speech difficulty.  Hematological: Negative.   Psychiatric/Behavioral: Negative.  Negative for depression and sleep disturbance. The patient is not nervous/anxious.      VITALS:  Blood pressure (!) 131/96, pulse 76, temperature 98.1 F (36.7 C), temperature source Oral, resp. rate 20, height 5\' 7"  (1.702 m), weight 286 lb 1.6 oz (129.8 kg), SpO2 97 %.  Wt Readings from Last 3 Encounters:  08/29/21 286 lb 1.6 oz (129.8 kg)  06/29/21 287 lb 14.4 oz (130.6 kg)  12/16/20 291 lb 4.8 oz (132.1 kg)    Body mass index is 44.81 kg/m.  Performance status (ECOG): 0 - Asymptomatic  PHYSICAL EXAM:  Physical Exam Constitutional:      General: She is not in acute distress.    Appearance: Normal appearance. She is normal weight.  HENT:     Head: Normocephalic and atraumatic.  Eyes:     General: No scleral icterus.    Extraocular Movements: Extraocular movements intact.     Conjunctiva/sclera: Conjunctivae normal.     Pupils: Pupils are equal, round, and reactive to light.  Cardiovascular:     Rate and Rhythm: Normal rate and regular rhythm.     Pulses: Normal pulses.     Heart sounds: Normal heart sounds. No murmur heard.   No friction rub. No gallop.  Pulmonary:     Effort: Pulmonary effort is normal. No respiratory distress.     Breath sounds: Normal breath sounds.  Abdominal:     General: Bowel sounds are normal. There is no distension.     Palpations: Abdomen is soft. There is hepatomegaly (mild). There is no splenomegaly or mass.     Tenderness: There is no abdominal tenderness.  Musculoskeletal:        General: Normal range of motion.     Cervical back: Normal range of motion and neck supple.     Right foot: Swelling (1+ edema) present.     Left foot: Swelling (1+ edema) present.  Lymphadenopathy:     Cervical: No cervical adenopathy.  Skin:    General: Skin is warm and dry.  Neurological:     General: No focal deficit present.     Mental Status: She is alert and oriented to person, place, and time. Mental status is at baseline.  Psychiatric:        Mood and Affect: Mood normal.        Behavior: Behavior normal.        Thought Content:  Thought content normal.        Judgment: Judgment normal.   LABS:      Latest Ref Rng & Units 08/29/2021   12:00 AM 06/29/2021   12:00 AM 12/16/2020   12:00 AM  CBC  WBC  5.5      5.8   8.9    Hemoglobin 12.0 - 16.0 17.2      9.0   8.8    Hematocrit 36 - 46 53      28   26    Platelets 150 - 400 K/uL 167      251   328       This result is from an external source.      Latest Ref Rng & Units 08/29/2021   12:00 AM 06/29/2021   12:00 AM 12/16/2020   12:00 AM  CMP  BUN 4 -  Creatinine 0.5 - 1.1 0.7      0.5   0.6    Sodium 137 - 147 144      142   142    Potassium 3.5 - 5.1 mEq/L 3.7      3.9   3.1    Chloride 99 - 108 108      109   105    CO2 13 - Calcium 8.7 - 10.7 9.4      9.2   9.2    Alkaline Phos 25 - 125 76      86   89    AST 13 - 35 34      39   42    ALT 7 - 35 U/L 36      36   29       This result is from an external source.    No results found for: TOTALPROTELP, ALBUMINELP, A1GS, A2GS, BETS, BETA2SER, GAMS, MSPIKE, SPEI Lab Results  Component Value Date   TIBC 442 06/29/2021   TIBC 426 12/16/2020   FERRITIN 322 (H) 06/29/2021   FERRITIN 338 (H) 12/16/2020   IRONPCTSAT 26 06/29/2021   IRONPCTSAT 29 12/16/2020   No results found for: LDH  STUDIES:  No results found.   EXAM: 06/20/2021 ULTRASOUND ABDOMEN LIMITED RIGHT UPPER QUADRANT   COMPARISON:  12/07/2020   FINDINGS:  Gallbladder:   Surgically absent   Common bile duct:   Diameter: 5 mm, normal   Liver:   Echogenic parenchyma, likely fatty infiltration though this can be  seen with cirrhosis and certain infiltrative disorders. No focal  hepatic mass or nodularity identified, though assessment of  intrahepatic detail is limited due to sound attenuation. Portal vein  is patent on color Doppler imaging with normal direction of blood  flow towards the liver.   Other: No RIGHT upper quadrant free fluid.   IMPRESSION:  Post cholecystectomy.   Probable  fatty infiltration of liver as above.   EXAM: 06/21/2021 ULTRASOUND ABDOMEN LIMITED   COMPARISON:  Abdominal ultrasound 12/07/2020.   FINDINGS:  Spleen is normal in appearance and measures 12.2 x 12.4 x 5.4 cm.  Splenic volume 431.4 cc.   IMPRESSION:  Spleen normal in size and appearance.   HISTORY:     Allergies: No Known Allergies  Current Medications: Current Outpatient Medications  Medication Sig Dispense Refill   folic acid (FOLVITE) 1 MG tablet Take 1 mg by mouth daily.     medroxyPROGESTERone Acetate 150 MG/ML SUSY Inject 1 mL into the muscle every 3 (three) months.     No current facility-administered medications for this visit.     ASSESSMENT & PLAN:   Assessment:   Echogenic liver compatible with chronic liver disease.  She also has mild hepatomegaly.  Her mother has fatty liver disease, but there is no other liver disease reported in her family.  I am sure her alcohol abuse is the main issue.  We discussed the importance of abstaining from alcohol. Other evaluation with Hepatitis A,B and C, Wilson's disease and alpha 1 antitrypsin deficiency was negative.  I advised that she avoid Tylenol, but may continue to use Advil if needed.  Significantly elevated iron level.  Evaluation for hemochromatosis was negative.  Anemia, which was significant in February with a hemoglobin of 4.2.  No etiology  was found but she did not undergo GI evaluation. I advised that we pursue this and will make the appropriate referral.   Alcohol abuse.  Between her and her fiance, they drink 1/5 of liquor per day.  She states that she is trying to cut back.  I have again admonished the importance of abstaining from alcohol entirely and that this is a danger to her life.  Tobacco abuse.  She currently smokes between 1-1.5 pack per day.  Status post cholecystectomy.  Plan: She was doing better after her transfusions, but no etiology has been found.  She has now come up from a hemoglobin of  9.0 to 17.2.  I think this is a combination of cutting back on alcohol intake, improve liver function, folate supplement, and better nutrition.  As above, we referred her to gastroenterology for further evaluation. I suspect that she may have had alcohol induced gastritis with slow blood loss. I again discussed with the patient the importance of the complete discontinuation of alcohol.  I can simply see her back as needed and her primary care provider can monitor her labs.  She understands and agrees with this plan of care.  I have answered her questions and she knows to call with any concerns.    I provided 15 minutes of face-to-face time during this this encounter and > 50% was spent counseling as documented under my assessment and plan.    Dellia Beckwith, MD [facility] AT [facility] [address] [place] [address] Dept: [phone] Dept Fax: [phone]

## 2021-08-29 ENCOUNTER — Encounter (INDEPENDENT_AMBULATORY_CARE_PROVIDER_SITE_OTHER): Payer: Self-pay

## 2021-08-29 ENCOUNTER — Inpatient Hospital Stay: Payer: Medicaid Other | Attending: Oncology | Admitting: Oncology

## 2021-08-29 ENCOUNTER — Inpatient Hospital Stay: Payer: Medicaid Other

## 2021-08-29 ENCOUNTER — Encounter: Payer: Self-pay | Admitting: Oncology

## 2021-08-29 VITALS — BP 131/96 | HR 76 | Temp 98.1°F | Resp 20 | Ht 67.0 in | Wt 286.1 lb

## 2021-08-29 DIAGNOSIS — D72829 Elevated white blood cell count, unspecified: Secondary | ICD-10-CM | POA: Diagnosis not present

## 2021-08-29 DIAGNOSIS — D649 Anemia, unspecified: Secondary | ICD-10-CM | POA: Diagnosis present

## 2021-08-29 DIAGNOSIS — D539 Nutritional anemia, unspecified: Secondary | ICD-10-CM | POA: Diagnosis not present

## 2021-08-29 DIAGNOSIS — E538 Deficiency of other specified B group vitamins: Secondary | ICD-10-CM

## 2021-08-29 LAB — CBC: RBC: 4.81 (ref 3.87–5.11)

## 2021-08-29 LAB — COMPREHENSIVE METABOLIC PANEL
Albumin: 4.4 (ref 3.5–5.0)
Calcium: 9.4 (ref 8.7–10.7)

## 2021-08-29 LAB — CBC AND DIFFERENTIAL
HCT: 53 — AB (ref 36–46)
Hemoglobin: 17.2 — AB (ref 12.0–16.0)
Neutrophils Absolute: 3.25
Platelets: 167 10*3/uL (ref 150–400)
WBC: 5.5

## 2021-08-29 LAB — HEPATIC FUNCTION PANEL
ALT: 36 U/L — AB (ref 7–35)
AST: 34 (ref 13–35)
Alkaline Phosphatase: 76 (ref 25–125)
Bilirubin, Total: 0.9

## 2021-08-29 LAB — BASIC METABOLIC PANEL
BUN: 7 (ref 4–21)
CO2: 25 — AB (ref 13–22)
Chloride: 108 (ref 99–108)
Creatinine: 0.7 (ref 0.5–1.1)
Glucose: 92
Potassium: 3.7 mEq/L (ref 3.5–5.1)
Sodium: 144 (ref 137–147)

## 2021-08-29 LAB — FOLATE: Folate: 9.8 ng/mL (ref >5.9)
